# Patient Record
Sex: Male | Born: 1945 | Race: Black or African American | Hispanic: No | Marital: Married | Smoking: Former smoker
Health system: Southern US, Community
[De-identification: ages and names within clinical notes are randomized; demographics above are authoritative.]

## PROBLEM LIST (undated history)

## (undated) DIAGNOSIS — I10 Essential (primary) hypertension: Secondary | ICD-10-CM

## (undated) DIAGNOSIS — M109 Gout, unspecified: Secondary | ICD-10-CM

## (undated) HISTORY — PX: NO PAST SURGERIES: SHX2092

## (undated) HISTORY — DX: Gout, unspecified: M10.9

## (undated) HISTORY — DX: Essential (primary) hypertension: I10

---

## 2018-09-17 ENCOUNTER — Ambulatory Visit: Payer: Self-pay | Admitting: Physician Assistant

## 2018-09-20 ENCOUNTER — Ambulatory Visit (INDEPENDENT_AMBULATORY_CARE_PROVIDER_SITE_OTHER): Payer: Medicare Other | Admitting: Physician Assistant

## 2018-09-20 ENCOUNTER — Other Ambulatory Visit: Payer: Self-pay

## 2018-09-20 ENCOUNTER — Encounter: Payer: Self-pay | Admitting: Physician Assistant

## 2018-09-20 VITALS — BP 174/114 | HR 64 | Temp 98.4°F | Ht 64.0 in | Wt 166.0 lb

## 2018-09-20 DIAGNOSIS — Z13220 Encounter for screening for lipoid disorders: Secondary | ICD-10-CM

## 2018-09-20 DIAGNOSIS — I10 Essential (primary) hypertension: Secondary | ICD-10-CM | POA: Diagnosis not present

## 2018-09-20 DIAGNOSIS — R351 Nocturia: Secondary | ICD-10-CM

## 2018-09-20 DIAGNOSIS — Z125 Encounter for screening for malignant neoplasm of prostate: Secondary | ICD-10-CM

## 2018-09-20 DIAGNOSIS — Z0000 Encounter for general adult medical examination without abnormal findings: Secondary | ICD-10-CM | POA: Diagnosis not present

## 2018-09-20 DIAGNOSIS — M1A9XX0 Chronic gout, unspecified, without tophus (tophi): Secondary | ICD-10-CM | POA: Diagnosis not present

## 2018-09-20 DIAGNOSIS — Z131 Encounter for screening for diabetes mellitus: Secondary | ICD-10-CM | POA: Diagnosis not present

## 2018-09-20 LAB — CBC WITH DIFFERENTIAL/PLATELET
Absolute Monocytes: 525 cells/uL (ref 200–950)
Basophils Absolute: 19 cells/uL (ref 0–200)
Basophils Relative: 0.5 %
Eosinophils Absolute: 141 cells/uL (ref 15–500)
Eosinophils Relative: 3.8 %
HCT: 41.2 % (ref 38.5–50.0)
Hemoglobin: 14.5 g/dL (ref 13.2–17.1)
Lymphs Abs: 1876 cells/uL (ref 850–3900)
MCH: 30.8 pg (ref 27.0–33.0)
MCHC: 35.2 g/dL (ref 32.0–36.0)
MCV: 87.5 fL (ref 80.0–100.0)
MPV: 10.2 fL (ref 7.5–12.5)
Monocytes Relative: 14.2 %
Neutro Abs: 1140 cells/uL — ABNORMAL LOW (ref 1500–7800)
Neutrophils Relative %: 30.8 %
Platelets: 143 10*3/uL (ref 140–400)
RBC: 4.71 10*6/uL (ref 4.20–5.80)
RDW: 12.8 % (ref 11.0–15.0)
Total Lymphocyte: 50.7 %
WBC: 3.7 10*3/uL — ABNORMAL LOW (ref 3.8–10.8)

## 2018-09-20 MED ORDER — CARVEDILOL 12.5 MG PO TABS: 12.5000 mg | ORAL_TABLET | Freq: Two times a day (BID) | ORAL | 5 refills | Status: DC

## 2018-09-20 MED ORDER — ALLOPURINOL 300 MG PO TABS: 300.0000 mg | ORAL_TABLET | Freq: Every day | ORAL | 6 refills | Status: DC

## 2018-09-20 MED ORDER — INDOMETHACIN 25 MG PO CAPS: ORAL_CAPSULE | ORAL | 2 refills | Status: AC

## 2018-09-20 NOTE — Patient Instructions (Signed)

## 2018-09-20 NOTE — Progress Notes (Signed)
New Patient Office Visit  Subjective:  Patient ID: Arthur Stewart, male    DOB: 31-Dec-1945  Age: 73 y.o. MRN: [id number]  CC:  Chief Complaint  Patient presents with  . Establish Care    HPI Arthur BaldingGregory Stewart presents to establish care and to get medication refills. He [place] and out of all of his medications for at least 3 weeks maybe longer.   He denies any CP, headaches, dizziness, SOB.   He does have intermittent gout flares. No current flare.   Past Medical History:  Diagnosis Date  . Gout   . Hypertension     Past Surgical History:  Procedure Laterality Date  . NO PAST SURGERIES      Family History  Problem Relation Age of Onset  . Hypertension Other     Social History   Socioeconomic History  . Marital status: Married    Spouse name: Not on file  . Number of children: Not on file  . Years of education: Not on file  . Highest education level: Not on file  Occupational History  . Not on file  Social Needs  . Financial resource strain: Not on file  . Food insecurity    Worry: Not on file    Inability: Not on file  . Transportation needs    Medical: Not on file    Non-medical: Not on file  Tobacco Use  . Smoking status: Former Smoker    Packs/day: 1.00    Years: 20.00    Pack years: 20.00    Types: Cigarettes  . Smokeless tobacco: Never Used  Substance and Sexual Activity  . Alcohol use: Not on file  . Drug use: Never  . Sexual activity: Yes    Partners: Female  Lifestyle  . Physical activity    Days per week: Not on file    Minutes per session: Not on file  . Stress: Not on file  Relationships  . Social Musicianconnections    Talks on phone: Not on file    Gets together: Not on file    Attends religious service: Not on file    Active member of club or organization: Not on file    Attends meetings of clubs or organizations: Not on file    Relationship status: Not on file  . Intimate partner violence    Fear of current or ex partner:  Not on file    Emotionally abused: Not on file    Physically abused: Not on file    Forced sexual activity: Not on file  Other Topics Concern  . Not on file  Social History Narrative  . Not on file    ROS Review of Systems  All other systems reviewed and are negative.   Objective:   Today's Vitals: BP (!) 174/114 Comment: patient took at home prior to appt  Pulse 64   Temp 98.4 F (36.9 C) (Oral)   Ht 5\' 4"  (1.626 m)   Wt 166 lb (75.3 kg)   SpO2 98%   BMI 28.49 kg/m   Physical Exam Vitals signs reviewed.  Constitutional:      Appearance: Normal appearance.  HENT:     Head: Normocephalic.  Cardiovascular:     Rate and Rhythm: Normal rate and regular rhythm.     Pulses: Normal pulses.  Pulmonary:     Effort: Pulmonary effort is normal.     Breath sounds: Normal breath sounds.  Neurological:     General: No  focal deficit present.     Mental Status: He is alert and oriented to person, place, and time.  Psychiatric:        Mood and Affect: Mood normal.        Behavior: Behavior normal.     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 allopurinol (ZYLOPRIM) 300 MG tablet Take 1 tablet (300 mg total) by mouth  daily.  . carvedilol (COREG) 12.5 MG tablet Take 1 tablet (12.5 mg total) by mouth 2 (two) times daily with a meal.  . indomethacin (INDOCIN) 25 MG capsule Take one tablet as needed up to twice a day for gout pain.   No facility-administered encounter medications on file as of 09/20/2018.    EKG showed NSR with no ST elevation or depression. QRS wave was more pronounced but likely due to elevated blood pressure today.   BP is very elevated. Discussed with patient risk of elevated BP. Pt is asymptomatic. I am concerned coreg is not going to lower BP enough. Will restart coreg and recheck BP in one week.   Restarted allopurinol concerned might have a flare restarting. colchine is contraindicated with coreg. He will use indomethacin for flare. Once he has been on for a while can check uric acid levels to see if he is where he needs to be.   Pt needs labs today for screening purposes.  Follow-up: Return in about 1 week (around 09/27/2018) for F/U HTN.   Iran Planas, PA-C

## 2018-09-21 ENCOUNTER — Encounter: Payer: Self-pay | Admitting: Physician Assistant

## 2018-09-21 ENCOUNTER — Other Ambulatory Visit: Payer: Self-pay | Admitting: Neurology

## 2018-09-21 DIAGNOSIS — E781 Pure hyperglyceridemia: Secondary | ICD-10-CM | POA: Insufficient documentation

## 2018-09-21 DIAGNOSIS — E785 Hyperlipidemia, unspecified: Secondary | ICD-10-CM | POA: Insufficient documentation

## 2018-09-21 DIAGNOSIS — R748 Abnormal levels of other serum enzymes: Secondary | ICD-10-CM | POA: Insufficient documentation

## 2018-09-21 LAB — COMPLETE METABOLIC PANEL WITH GFR
AG Ratio: 1.1 (calc) (ref 1.0–2.5)
ALT: 42 U/L (ref 9–46)
AST: 61 U/L — ABNORMAL HIGH (ref 10–35)
Albumin: 4 g/dL (ref 3.6–5.1)
Alkaline phosphatase (APISO): 114 U/L (ref 35–144)
BUN: 17 mg/dL (ref 7–25)
CO2: 26 mmol/L (ref 20–32)
Calcium: 10.3 mg/dL (ref 8.6–10.3)
Chloride: 102 mmol/L (ref 98–110)
Creat: 0.99 mg/dL (ref 0.70–1.18)
GFR, Est African American: 88 mL/min/{1.73_m2} (ref >=60)
GFR, Est Non African American: 76 mL/min/{1.73_m2} (ref >=60)
Globulin: 3.6 g/dL (calc) (ref 1.9–3.7)
Glucose, Bld: 97 mg/dL (ref 65–99)
Potassium: 4 mmol/L (ref 3.5–5.3)
Sodium: 140 mmol/L (ref 135–146)
Total Bilirubin: 1 mg/dL (ref 0.2–1.2)
Total Protein: 7.6 g/dL (ref 6.1–8.1)

## 2018-09-21 LAB — LIPID PANEL W/REFLEX DIRECT LDL
Cholesterol: 201 mg/dL — ABNORMAL HIGH (ref <200)
HDL: 59 mg/dL (ref >=40)
LDL Cholesterol (Calc): 106 mg/dL (calc) — ABNORMAL HIGH
Non-HDL Cholesterol (Calc): 142 mg/dL (calc) — ABNORMAL HIGH (ref <130)
Total CHOL/HDL Ratio: 3.4 (calc) (ref <5.0)
Triglycerides: 236 mg/dL — ABNORMAL HIGH (ref <150)

## 2018-09-21 LAB — PSA: PSA: 0.7 ng/mL (ref <=4.0)

## 2018-09-21 NOTE — Progress Notes (Signed)
Call pt: Labs look pretty good. Kidney and glucose look great.  You have one liver enzyme that is elevated. Lets recheck in 1 month and see if resolves or worsens. Avoid a lot of tylenol products or alcohol.  Prostate screening looks great.   Your cholesterol does not look terrible with numbers but when we include you risk factors there is a 19 percent CV 10 year risk. Anything over 7.5 AHA recommends statin therapy. I believe this is the next best step to decrease your risk. Can I send to pharmacy?   Your TG are elevated as well. Taking fish oil 4000mg  daily could help with that.

## 2018-09-24 MED ORDER — ATORVASTATIN CALCIUM 20 MG PO TABS: 20.0000 mg | ORAL_TABLET | Freq: Every day | ORAL | 3 refills | Status: AC

## 2018-09-24 NOTE — Addendum Note (Signed)
Addended by: Donella Stade on: 09/24/2018 07:30 AM   Modules accepted: Orders

## 2018-09-27 ENCOUNTER — Ambulatory Visit: Payer: Medicare Other | Admitting: Physician Assistant

## 2018-09-28 ENCOUNTER — Ambulatory Visit (INDEPENDENT_AMBULATORY_CARE_PROVIDER_SITE_OTHER): Payer: Medicare Other | Admitting: Physician Assistant

## 2018-09-28 ENCOUNTER — Encounter: Payer: Self-pay | Admitting: Physician Assistant

## 2018-09-28 ENCOUNTER — Other Ambulatory Visit: Payer: Self-pay

## 2018-09-28 VITALS — BP 188/121 | HR 64 | Ht 64.0 in | Wt 170.0 lb

## 2018-09-28 DIAGNOSIS — I10 Essential (primary) hypertension: Secondary | ICD-10-CM | POA: Diagnosis not present

## 2018-09-28 MED ORDER — AMLODIPINE BESYLATE 5 MG PO TABS: 5.0000 mg | ORAL_TABLET | Freq: Every day | ORAL | 0 refills | Status: DC

## 2018-09-28 NOTE — Patient Instructions (Signed)
Report blood pressure readings from now until Tuesday on Tuesday next week.   Start norvasc.   DASH Eating Plan DASH stands for "Dietary Approaches to Stop Hypertension." The DASH eating plan is a healthy eating plan that has been shown to reduce high blood pressure (hypertension). It may also reduce your risk for type 2 diabetes, heart disease, and stroke. The DASH eating plan may also help with weight loss. What are tips for following this plan?  General guidelines  Avoid eating more than 2,300 mg (milligrams) of salt (sodium) a day. If you have hypertension, you may need to reduce your sodium intake to 1,500 mg a day.  Limit alcohol intake to no more than 1 drink a day for nonpregnant women and 2 drinks a day for men. One drink equals 12 oz of beer, 5 oz of wine, or 1 oz of hard liquor.  Work with your health care provider to maintain a healthy body weight or to lose weight. Ask what an ideal weight is for you.  Get at least 30 minutes of exercise that causes your heart to beat faster (aerobic exercise) most days of the week. Activities may include walking, swimming, or biking.  Work with your health care provider or diet and nutrition specialist (dietitian) to adjust your eating plan to your individual calorie needs. Reading food labels   Check food labels for the amount of sodium per serving. Choose foods with less than 5 percent of the Daily Value of sodium. Generally, foods with less than 300 mg of sodium per serving fit into this eating plan.  To find whole grains, look for the word "whole" as the first word in the ingredient list. Shopping  Buy products labeled as "low-sodium" or "no salt added."  Buy fresh foods. Avoid canned foods and premade or frozen meals. Cooking  Avoid adding salt when cooking. Use salt-free seasonings or herbs instead of table salt or sea salt. Check with your health care provider or pharmacist before using salt substitutes.  Do not fry foods. Cook  foods using healthy methods such as baking, boiling, grilling, and broiling instead.  Cook with heart-healthy oils, such as olive, canola, soybean, or sunflower oil. Meal planning  Eat a balanced diet that includes: ? 5 or more servings of fruits and vegetables each day. At each meal, try to fill half of your plate with fruits and vegetables. ? Up to 6-8 servings of whole grains each day. ? Less than 6 oz of lean meat, poultry, or fish each day. A 3-oz serving of meat is about the same size as a deck of cards. One egg equals 1 oz. ? 2 servings of low-fat dairy each day. ? A serving of nuts, seeds, or beans 5 times each week. ? Heart-healthy fats. Healthy fats called Omega-3 fatty acids are found in foods such as flaxseeds and coldwater fish, like sardines, salmon, and mackerel.  Limit how much you eat of the following: ? Canned or prepackaged foods. ? Food that is high in trans fat, such as fried foods. ? Food that is high in saturated fat, such as fatty meat. ? Sweets, desserts, sugary drinks, and other foods with added sugar. ? Full-fat dairy products.  Do not salt foods before eating.  Try to eat at least 2 vegetarian meals each week.  Eat more home-cooked food and less restaurant, buffet, and fast food.  When eating at a restaurant, ask that your food be prepared with less salt or no salt, if possible.  What foods are recommended? The items listed may not be a complete list. Talk with your dietitian about what dietary choices are best for you. Grains Whole-grain or whole-wheat bread. Whole-grain or whole-wheat pasta. Brown rice. [place]. Bulgur. Whole-grain and low-sodium cereals. Pita bread. Low-fat, low-sodium crackers. Whole-wheat flour tortillas. Vegetables Fresh or frozen vegetables (raw, steamed, roasted, or grilled). Low-sodium or reduced-sodium tomato and vegetable juice. Low-sodium or reduced-sodium tomato sauce and tomato paste. Low-sodium or reduced-sodium canned  vegetables. Fruits All fresh, dried, or frozen fruit. Canned fruit in natural juice (without added sugar). Meat and other protein foods Skinless chicken or [place]. Ground chicken or [place]. Pork with fat trimmed off. Fish and seafood. Egg whites. Dried beans, peas, or lentils. Unsalted nuts, nut butters, and seeds. Unsalted canned beans. Lean cuts of beef with fat trimmed off. Low-sodium, lean deli meat. Dairy Low-fat (1%) or fat-free (skim) milk. Fat-free, low-fat, or reduced-fat cheeses. Nonfat, low-sodium ricotta or cottage cheese. Low-fat or nonfat yogurt. Low-fat, low-sodium cheese. Fats and oils Soft margarine without trans fats. Vegetable oil. Low-fat, reduced-fat, or light mayonnaise and salad dressings (reduced-sodium). Canola, safflower, olive, soybean, and sunflower oils. Avocado. Seasoning and other foods Herbs. Spices. Seasoning mixes without salt. Unsalted popcorn and pretzels. Fat-free sweets. What foods are not recommended? The items listed may not be a complete list. Talk with your dietitian about what dietary choices are best for you. Grains Baked goods made with fat, such as croissants, muffins, or some breads. Dry pasta or rice meal packs. Vegetables Creamed or fried vegetables. Vegetables in a cheese sauce. Regular canned vegetables (not low-sodium or reduced-sodium). Regular canned tomato sauce and paste (not low-sodium or reduced-sodium). Regular tomato and vegetable juice (not low-sodium or reduced-sodium). Angie Fava. Olives. Fruits Canned fruit in a light or heavy syrup. Fried fruit. Fruit in cream or butter sauce. Meat and other protein foods Fatty cuts of meat. Ribs. Fried meat. Berniece Salines. Sausage. Bologna and other processed lunch meats. Salami. Fatback. Hotdogs. Bratwurst. Salted nuts and seeds. Canned beans with added salt. Canned or smoked fish. Whole eggs or egg yolks. Chicken or [place] with skin. Dairy Whole or 2% milk, cream, and half-and-half. Whole or full-fat  cream cheese. Whole-fat or sweetened yogurt. Full-fat cheese. Nondairy creamers. Whipped toppings. Processed cheese and cheese spreads. Fats and oils Butter. Stick margarine. Lard. Shortening. Ghee. Bacon fat. Tropical oils, such as coconut, palm kernel, or palm oil. Seasoning and other foods Salted popcorn and pretzels. Onion salt, garlic salt, seasoned salt, table salt, and sea salt. Worcestershire sauce. Tartar sauce. Barbecue sauce. Teriyaki sauce. Soy sauce, including reduced-sodium. Steak sauce. Canned and packaged gravies. Fish sauce. Oyster sauce. Cocktail sauce. Horseradish that you find on the shelf. Ketchup. Mustard. Meat flavorings and tenderizers. Bouillon cubes. Hot sauce and Tabasco sauce. Premade or packaged marinades. Premade or packaged taco seasonings. Relishes. Regular salad dressings. Where to find more information:  National Heart, Lung, and [place]: https://wilson-eaton.com/  American Heart Association: www.heart.org Summary  The DASH eating plan is a healthy eating plan that has been shown to reduce high blood pressure (hypertension). It may also reduce your risk for type 2 diabetes, heart disease, and stroke.  With the DASH eating plan, you should limit salt (sodium) intake to 2,300 mg a day. If you have hypertension, you may need to reduce your sodium intake to 1,500 mg a day.  When on the DASH eating plan, aim to eat more fresh fruits and vegetables, whole grains, lean proteins, low-fat dairy, and heart-healthy fats.  Work  with your health care provider or diet and nutrition specialist (dietitian) to adjust your eating plan to your individual calorie needs. This information is not intended to replace advice given to you by your health care provider. Make sure you discuss any questions you have with your health care provider. Document Released: 02/03/2011 Document Revised: 01/27/2017 Document Reviewed: 02/08/2016 Elsevier Patient Education  2020 [employer].

## 2018-09-28 NOTE — Progress Notes (Signed)
   Subjective:    Patient ID: Arthur Stewart, male    DOB: May 14, 1945, 73 y.o.   MRN: [id number]  HPI  Pt is a 73 yo male with uncontrolled HTN who presents to the clinic for 2 week follow up. Last visit patient was not on medication for 8 weeks. His medications were restarted which included coreg.  His BP has not gone down very much. He does report better readings at home of 160's over 90's. Pt is completely asymptomatic no CP, palpitations, headache or vision changes.   .. Active Ambulatory Problems    Diagnosis Date Noted  . Uncontrolled hypertension 09/20/2018  . Chronic gout without tophus 09/20/2018  . Nocturia 09/20/2018  . Hyperlipidemia 09/21/2018  . Elevated liver enzymes 09/21/2018  . Hypertriglyceridemia 09/21/2018   Resolved Ambulatory Problems    Diagnosis Date Noted  . No Resolved Ambulatory Problems   Past Medical History:  Diagnosis Date  . Gout   . Hypertension    Reviewed med, allergy, problem list.    Review of Systems See HPI.     Objective:   Physical Exam Vitals signs reviewed.  Constitutional:      Appearance: Normal appearance.  HENT:     Head: Normocephalic.  Cardiovascular:     Rate and Rhythm: Normal rate and regular rhythm.     Pulses: Normal pulses.  Pulmonary:     Effort: Pulmonary effort is normal.     Breath sounds: Normal breath sounds.  Neurological:     General: No focal deficit present.     Mental Status: He is alert and oriented to person, place, and time.  Psychiatric:        Mood and Affect: Mood normal.        Behavior: Behavior normal.           Assessment & Plan:  Marland KitchenMarland KitchenRoswell was seen today for hypertension.  Diagnoses and all orders for this visit:  Uncontrolled hypertension -     amLODipine (NORVASC) 5 MG tablet; Take 1 tablet (5 mg total) by mouth daily.  HR 60. BP very elevated. Better readings reported at home. Added norvasc. Keep checking BP at home. Report readings next Tuesday. Will adjust accordingly.  Recheck in office in 3 weeks.  Discussed low salt diet. Discussed IF 16:8 has proven to help lower BP overtime. Discussed risks of uncontrolled HTN.

## 2018-10-02 ENCOUNTER — Ambulatory Visit (INDEPENDENT_AMBULATORY_CARE_PROVIDER_SITE_OTHER): Payer: Medicare Other | Admitting: Physician Assistant

## 2018-10-02 ENCOUNTER — Other Ambulatory Visit: Payer: Self-pay

## 2018-10-02 DIAGNOSIS — I10 Essential (primary) hypertension: Secondary | ICD-10-CM

## 2018-10-02 MED ORDER — AMLODIPINE BESYLATE 5 MG PO TABS: 5.0000 mg | ORAL_TABLET | Freq: Every day | ORAL | 0 refills | Status: DC

## 2018-10-02 NOTE — Progress Notes (Signed)
   Subjective:    Patient ID: Arthur Stewart, male    DOB: [birth year], 73 y.o.   MRN: [id number]  HPI  Pt is a 73 yo male with uncontrolled HTN who presents to the clinic for BP recheck. He continues to be asymptomatic. He does admit to feeling much better on the medication. He is checking BP at home and at the beginning running 150's over 90s but yesterday was 118's over 80's. He denies any dizziness.   .. Active Ambulatory Problems    Diagnosis Date Noted  . Uncontrolled hypertension 09/20/2018  . Chronic gout without tophus 09/20/2018  . Nocturia 09/20/2018  . Hyperlipidemia 09/21/2018  . Elevated liver enzymes 09/21/2018  . Hypertriglyceridemia 09/21/2018   Resolved Ambulatory Problems    Diagnosis Date Noted  . No Resolved Ambulatory Problems   Past Medical History:  Diagnosis Date  . Gout   . Hypertension        Review of Systems  All other systems reviewed and are negative.      Objective:   Physical Exam Vitals signs reviewed.  Constitutional:      Appearance: Normal appearance.  Cardiovascular:     Rate and Rhythm: Normal rate and regular rhythm.     Pulses: Normal pulses.  Pulmonary:     Effort: Pulmonary effort is normal.     Breath sounds: Normal breath sounds.  Neurological:     General: No focal deficit present.     Mental Status: He is alert and oriented to person, place, and time.  Psychiatric:        Mood and Affect: Mood normal.        Behavior: Behavior normal.           Assessment & Plan:  Marland KitchenMarland KitchenNechemia was seen today for hypertension.  Diagnoses and all orders for this visit:  Uncontrolled hypertension -     amLODipine (NORVASC) 5 MG tablet; Take 1 tablet (5 mg total) by mouth daily.   First BP in the office was terrible. 2nd recheck was much better. His home readings look awesome. He is going to continue to monitor BP at home and keep a log. Continue on norvasc and coreg and follow up in 1 month. Reminder to lower salt in diet.

## 2018-10-30 ENCOUNTER — Other Ambulatory Visit: Payer: Self-pay

## 2018-10-30 ENCOUNTER — Encounter: Payer: Self-pay | Admitting: Physician Assistant

## 2018-10-30 ENCOUNTER — Ambulatory Visit (INDEPENDENT_AMBULATORY_CARE_PROVIDER_SITE_OTHER): Payer: Medicare Other | Admitting: Physician Assistant

## 2018-10-30 VITALS — BP 142/90 | HR 60 | Ht 64.0 in | Wt 178.0 lb

## 2018-10-30 DIAGNOSIS — M25561 Pain in right knee: Secondary | ICD-10-CM | POA: Diagnosis not present

## 2018-10-30 DIAGNOSIS — M1A9XX0 Chronic gout, unspecified, without tophus (tophi): Secondary | ICD-10-CM | POA: Diagnosis not present

## 2018-10-30 DIAGNOSIS — Z1211 Encounter for screening for malignant neoplasm of colon: Secondary | ICD-10-CM | POA: Diagnosis not present

## 2018-10-30 DIAGNOSIS — G8929 Other chronic pain: Secondary | ICD-10-CM

## 2018-10-30 DIAGNOSIS — I10 Essential (primary) hypertension: Secondary | ICD-10-CM

## 2018-10-30 MED ORDER — DICLOFENAC SODIUM 1 % TD GEL: 4.0000 g | Freq: Four times a day (QID) | TRANSDERMAL | 1 refills | Status: AC

## 2018-10-30 MED ORDER — AMLODIPINE BESYLATE 5 MG PO TABS: 5.0000 mg | ORAL_TABLET | Freq: Every day | ORAL | 1 refills | Status: AC

## 2018-10-30 MED ORDER — ALLOPURINOL 300 MG PO TABS: 300.0000 mg | ORAL_TABLET | Freq: Every day | ORAL | 1 refills | Status: AC

## 2018-10-30 MED ORDER — CARVEDILOL 12.5 MG PO TABS: 12.5000 mg | ORAL_TABLET | Freq: Two times a day (BID) | ORAL | 1 refills | Status: AC

## 2018-10-30 NOTE — Progress Notes (Signed)
Subjective:    Patient ID: Arthur Stewart, male    DOB: [birth year], 73 y.o.   MRN: [id number]  HPI  Pt is a 73 yo male with HTN, HLD, and gout who presents to the clinic for BP refills.   He feels great. He has been checking his BP daily and ranges between 120's and 150's and 80-90's. He denies any headaches, vision changes, dizziness. He is taking his medications daily.   He does take indomethcin occasionally if he feels a gout attack or if his right knee hurts a lot. He has been noticing more right knee pain off and on. Has had some pain for over a year but increasing in frequency. No trauma or fall.  It is very stiff and swollen more in the mornings and gets better throughout the day.   .. Active Ambulatory Problems    Diagnosis Date Noted  . Hypertension, essential, benign 09/20/2018  . Chronic gout without tophus 09/20/2018  . Nocturia 09/20/2018  . Hyperlipidemia 09/21/2018  . Elevated liver enzymes 09/21/2018  . Hypertriglyceridemia 09/21/2018  . Chronic pain of right knee 10/30/2018   Resolved Ambulatory Problems    Diagnosis Date Noted  . No Resolved Ambulatory Problems   Past Medical History:  Diagnosis Date  . Gout   . Hypertension      Review of Systems  All other systems reviewed and are negative.      Objective:   Physical Exam Vitals signs reviewed.  Constitutional:      Appearance: Normal appearance.  HENT:     Head: Normocephalic.  Cardiovascular:     Rate and Rhythm: Normal rate and regular rhythm.     Pulses: Normal pulses.     Heart sounds: Normal heart sounds.  Pulmonary:     Effort: Pulmonary effort is normal.     Breath sounds: Normal breath sounds.  Neurological:     General: No focal deficit present.     Mental Status: He is alert and oriented to person, place, and time.  Psychiatric:        Mood and Affect: Mood normal.        Behavior: Behavior normal.           Assessment & Plan:  Marland KitchenMarland KitchenDallen was seen today for  hypertension.  Diagnoses and all orders for this visit:  Hypertension, essential, benign -     amLODipine (NORVASC) 5 MG tablet; Take 1 tablet (5 mg total) by mouth daily. -     carvedilol (COREG) 12.5 MG tablet; Take 1 tablet (12.5 mg total) by mouth 2 (two) times daily with a meal.  Colon cancer screening -     Ambulatory referral to Gastroenterology  Uncontrolled hypertension  Chronic pain of right knee -     diclofenac sodium (VOLTAREN) 1 % GEL; Apply 4 g topically 4 (four) times daily. To affected joint.  Chronic gout without tophus, unspecified cause, unspecified site -     allopurinol (ZYLOPRIM) 300 MG tablet; Take 1 tablet (300 mg total) by mouth daily.   2nd BP much better and home BP are much better. Diastaltic is still not to goal but at times does drop low 80's. Discussed NSAIDs could increase BP. Use sparingly. 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 Declined xrays today. Given HO. Use gel and consider tumeric or glucosamine chondrotin. Follow up for xrays and/or to consider more intervention with sports medicine.   Resent all medications for 90 days.   Follow up in 6 months for BP  check.

## 2018-10-30 NOTE — Patient Instructions (Signed)
Glucosamine chondrotin Or  Tumeric.   Osteoarthritis  Osteoarthritis is a type of arthritis that affects tissue that covers the ends of bones in joints (cartilage). Cartilage acts as a cushion between the bones and helps them move smoothly. Osteoarthritis results when cartilage in the joints gets worn down. Osteoarthritis is sometimes called "wear and tear" arthritis. Osteoarthritis is the most common form of arthritis. It often occurs in older people. It is a condition that gets worse over time (a progressive condition). Joints that are most often affected by this condition are in:  Fingers.  Toes.  Hips.  Knees.  Spine, including neck and lower back. What are the causes? This condition is caused by age-related wearing down of cartilage that covers the ends of bones. What increases the risk? The following factors may make you more likely to develop this condition:  Older age.  Being overweight or obese.  Overuse of joints, such as in athletes.  Past injury of a joint.  Past surgery on a joint.  Family history of osteoarthritis. What are the signs or symptoms? The main symptoms of this condition are pain, swelling, and stiffness in the joint. The joint may lose its shape over time. Small pieces of bone or cartilage may break off and float inside of the joint, which may cause more pain and damage to the joint. Small deposits of bone (osteophytes) may grow on the edges of the joint. Other symptoms may include:  A grating or scraping feeling inside the joint when you move it.  Popping or creaking sounds when you move. Symptoms may affect one or more joints. Osteoarthritis in a major joint, such as your knee or hip, can make it painful to walk or exercise. If you have osteoarthritis in your hands, you might not be able to grip items, twist your hand, or control small movements of your hands and fingers (fine motor skills). How is this diagnosed? This condition may be diagnosed  based on:  Your medical history.  A physical exam.  Your symptoms.  X-rays of the affected joint(s).  Blood tests to rule out other types of arthritis. How is this treated? There is no cure for this condition, but treatment can help to control pain and improve joint function. Treatment plans may include:  A prescribed exercise program that allows for rest and joint relief. You may work with a physical therapist.  A weight control plan.  Pain relief techniques, such as: ? Applying heat and cold to the joint. ? Electric pulses delivered to nerve endings under the skin (transcutaneous electrical nerve stimulation, or TENS). ? Massage. ? Certain nutritional supplements.  NSAIDs or prescription medicines to help relieve pain.  Medicine to help relieve pain and inflammation (corticosteroids). This can be given by mouth (orally) or as an injection.  Assistive devices, such as a brace, wrap, splint, specialized glove, or cane.  Surgery, such as: ? An osteotomy. This is done to reposition the bones and relieve pain or to remove loose pieces of bone and cartilage. ? Joint replacement surgery. You may need this surgery if you have very bad (advanced) osteoarthritis. Follow these instructions at home: Activity  Rest your affected joints as directed by your health care provider.  Do not drive or use heavy machinery while taking prescription pain medicine.  Exercise as directed. Your health care provider or physical therapist may recommend specific types of exercise, such as: ? Strengthening exercises. These are done to strengthen the muscles that support joints that are  affected by arthritis. They can be performed with weights or with exercise bands to add resistance. ? Aerobic activities. These are exercises, such as brisk walking or water aerobics, that get your heart pumping. ? Range-of-motion activities. These keep your joints easy to move. ? Balance and agility exercises.  Managing pain, stiffness, and swelling      If directed, apply heat to the affected area as often as told by your health care provider. Use the heat source that your health care provider recommends, such as a moist heat pack or a heating pad. ? If you have a removable assistive device, remove it as told by your health care provider. ? Place a towel between your skin and the heat source. If your health care provider tells you to keep the assistive device on while you apply heat, place a towel between the assistive device and the heat source. ? Leave the heat on for 20-30 minutes. ? Remove the heat if your skin turns bright red. This is especially important if you are unable to feel pain, heat, or cold. You may have a greater risk of getting burned.  If directed, put ice on the affected joint: ? If you have a removable assistive device, remove it as told by your health care provider. ? Put ice in a plastic bag. ? Place a towel between your skin and the bag. If your health care provider tells you to keep the assistive device on during icing, place a towel between the assistive device and the bag. ? Leave the ice on for 20 minutes, 2-3 times a day. General instructions  Take over-the-counter and prescription medicines only as told by your health care provider.  Maintain a healthy weight. Follow instructions from your health care provider for weight control. These may include dietary restrictions.  Do not use any products that contain nicotine or tobacco, such as cigarettes and e-cigarettes. These can delay bone healing. If you need help quitting, ask your health care provider.  Use assistive devices as directed by your health care provider.  Keep all follow-up visits as told by your health care provider. This is important. Where to find more information  [employer] of Arthritis and Musculoskeletal and Skin Diseases: www.niams.SouthExposed.es  [employer] on Aging: http://kim-miller.com/   American College of Rheumatology: www.rheumatology.org Contact a health care provider if:  Your skin turns red.  You develop a rash.  You have pain that gets worse.  You have a fever along with joint or muscle aches. Get help right away if:  You lose a lot of weight.  You suddenly lose your appetite.  You have night sweats. Summary  Osteoarthritis is a type of arthritis that affects tissue covering the ends of bones in joints (cartilage).  This condition is caused by age-related wearing down of cartilage that covers the ends of bones.  The main symptom of this condition is pain, swelling, and stiffness in the joint.  There is no cure for this condition, but treatment can help to control pain and improve joint function. This information is not intended to replace advice given to you by your health care provider. Make sure you discuss any questions you have with your health care provider. Document Released: 02/14/2005 Document Revised: 01/27/2017 Document Reviewed: 10/19/2015 Elsevier Patient Education  2020 [employer].

## 2018-12-26 NOTE — Addendum Note (Signed)
Addended byAnnamaria Helling on: 12/26/2018 08:13 AM   Modules accepted: Orders

## 2019-03-27 ENCOUNTER — Telehealth: Payer: Self-pay

## 2019-03-27 ENCOUNTER — Other Ambulatory Visit: Payer: Self-pay

## 2019-03-27 ENCOUNTER — Encounter: Payer: Self-pay | Admitting: Physician Assistant

## 2019-03-27 ENCOUNTER — Ambulatory Visit (INDEPENDENT_AMBULATORY_CARE_PROVIDER_SITE_OTHER): Payer: Medicare Other

## 2019-03-27 ENCOUNTER — Ambulatory Visit (INDEPENDENT_AMBULATORY_CARE_PROVIDER_SITE_OTHER): Payer: Medicare Other | Admitting: Physician Assistant

## 2019-03-27 VITALS — BP 139/98 | HR 80 | Ht 64.0 in | Wt 178.0 lb

## 2019-03-27 DIAGNOSIS — E871 Hypo-osmolality and hyponatremia: Secondary | ICD-10-CM

## 2019-03-27 DIAGNOSIS — Z8616 Personal history of COVID-19: Secondary | ICD-10-CM

## 2019-03-27 DIAGNOSIS — R509 Fever, unspecified: Secondary | ICD-10-CM

## 2019-03-27 DIAGNOSIS — Z87891 Personal history of nicotine dependence: Secondary | ICD-10-CM | POA: Diagnosis not present

## 2019-03-27 DIAGNOSIS — R531 Weakness: Secondary | ICD-10-CM

## 2019-03-27 DIAGNOSIS — N179 Acute kidney failure, unspecified: Secondary | ICD-10-CM

## 2019-03-27 DIAGNOSIS — J181 Lobar pneumonia, unspecified organism: Secondary | ICD-10-CM | POA: Diagnosis not present

## 2019-03-27 DIAGNOSIS — J189 Pneumonia, unspecified organism: Secondary | ICD-10-CM

## 2019-03-27 DIAGNOSIS — R197 Diarrhea, unspecified: Secondary | ICD-10-CM

## 2019-03-27 LAB — COMPLETE METABOLIC PANEL WITH GFR
AG Ratio: 0.7 (calc) — ABNORMAL LOW (ref 1.0–2.5)
ALT: 36 U/L (ref 9–46)
AST: 60 U/L — ABNORMAL HIGH (ref 10–35)
Albumin: 2.9 g/dL — ABNORMAL LOW (ref 3.6–5.1)
Alkaline phosphatase (APISO): 74 U/L (ref 35–144)
BUN/Creatinine Ratio: 21 (calc) (ref 6–22)
BUN: 35 mg/dL — ABNORMAL HIGH (ref 7–25)
CO2: 25 mmol/L (ref 20–32)
Calcium: 8.8 mg/dL (ref 8.6–10.3)
Chloride: 95 mmol/L — ABNORMAL LOW (ref 98–110)
Creat: 1.63 mg/dL — ABNORMAL HIGH (ref 0.70–1.18)
GFR, Est African American: 48 mL/min/{1.73_m2} — ABNORMAL LOW (ref >=60)
GFR, Est Non African American: 41 mL/min/{1.73_m2} — ABNORMAL LOW (ref >=60)
Globulin: 4.3 g/dL (calc) — ABNORMAL HIGH (ref 1.9–3.7)
Glucose, Bld: 126 mg/dL — ABNORMAL HIGH (ref 65–99)
Potassium: 4.1 mmol/L (ref 3.5–5.3)
Sodium: 128 mmol/L — ABNORMAL LOW (ref 135–146)
Total Bilirubin: 1.9 mg/dL — ABNORMAL HIGH (ref 0.2–1.2)
Total Protein: 7.2 g/dL (ref 6.1–8.1)

## 2019-03-27 LAB — CBC WITH DIFFERENTIAL/PLATELET
Absolute Monocytes: 644 cells/uL (ref 200–950)
Basophils Absolute: 10 cells/uL (ref 0–200)
Basophils Relative: 0.1 %
Eosinophils Absolute: 99 cells/uL (ref 15–500)
Eosinophils Relative: 1 %
HCT: 36.7 % — ABNORMAL LOW (ref 38.5–50.0)
Hemoglobin: 12.7 g/dL — ABNORMAL LOW (ref 13.2–17.1)
Lymphs Abs: 901 cells/uL (ref 850–3900)
MCH: 30.8 pg (ref 27.0–33.0)
MCHC: 34.6 g/dL (ref 32.0–36.0)
MCV: 88.9 fL (ref 80.0–100.0)
MPV: 9.7 fL (ref 7.5–12.5)
Monocytes Relative: 6.5 %
Neutro Abs: 8247 cells/uL — ABNORMAL HIGH (ref 1500–7800)
Neutrophils Relative %: 83.3 %
Platelets: 265 10*3/uL (ref 140–400)
RBC: 4.13 10*6/uL — ABNORMAL LOW (ref 4.20–5.80)
RDW: 12 % (ref 11.0–15.0)
Total Lymphocyte: 9.1 %
WBC: 9.9 10*3/uL (ref 3.8–10.8)

## 2019-03-27 MED ORDER — AZITHROMYCIN 250 MG PO TABS: ORAL_TABLET | ORAL | 0 refills | Status: DC

## 2019-03-27 NOTE — Progress Notes (Signed)
Bilateral pneumonia. Could be more viral but with your worsening symptoms will start antibiotic therapy for bacterial coverage. Will also call with labs when get them.

## 2019-03-27 NOTE — Telephone Encounter (Signed)
Patient has already had his labs drawn. Advised to start antibiotic.

## 2019-03-27 NOTE — Telephone Encounter (Signed)
Arthur Stewart's wife called and states there was a little confusion about the lab. She states he left without having the labs drawn. She states he will come back later today.

## 2019-03-27 NOTE — Progress Notes (Signed)
Patient ID: Arthur Stewart, male   DOB: [birth year], 74 y.o.   MRN: [id number] .Marland KitchenVirtual Visit via Video Note  I connected with Anda Kraft on 03/27/2019 at  8:30 AM EST by a telemedicine application and verified that I am speaking with the correct person using two identifiers.  Location: Patient: home Provider: clinic   I discussed the limitations of evaluation and management by telemedicine and the availability of in person appointments. The patient expressed understanding and agreed to proceed.  History of Present Illness: Pt is a 74 yo male with HTN, HLD who calls into the clinic with his wife to discuss covid symptoms. The whole family was positive on Jan. 4th. He got tested due to household exposure with his son. His symptoms started more mild at first. He is now over the past few days starting to feel worse. Today is covid day 23. For 2-3 days had some loose stool. Took imodium and helped a lot. Has no energy and very weak. Per wife had a fever of 103 last night. No rashes. No SOB. He has been checking his lungs and pulse ox 95 percent or better. Wife is concerned about dehydration.   .. Active Ambulatory Problems    Diagnosis Date Noted  . Hypertension, essential, benign 09/20/2018  . Chronic gout without tophus 09/20/2018  . Nocturia 09/20/2018  . Hyperlipidemia 09/21/2018  . Elevated liver enzymes 09/21/2018  . Hypertriglyceridemia 09/21/2018  . Chronic pain of right knee 10/30/2018  . Hyponatremia 03/29/2019  . AKI (acute kidney injury) ([address]) 03/29/2019   Resolved Ambulatory Problems    Diagnosis Date Noted  . No Resolved Ambulatory Problems   Past Medical History:  Diagnosis Date  . Gout   . Hypertension    Reviewed med, allergy, problem list.    Observations/Objective: Most of conversation done through wife.  He agreed or disagreed with statements.  Sounded weak and tired.  No cough.   .. Today's Vitals   03/27/19 0720  BP: (!) 139/98  Pulse: 80  Weight:  178 lb (80.7 kg)  Height: 5\' 4"  (1.626 m)   Body mass index is 30.55 kg/m.  .. Results for orders placed or performed in visit on 03/27/19  COMPLETE METABOLIC PANEL WITH GFR  Result Value Ref Range   Glucose, Bld 126 (H) 65 - 99 mg/dL   BUN 35 (H) 7 - 25 mg/dL   Creat 1.63 (H) 0.70 - 1.18 mg/dL   GFR, Est Non African American 41 (L) > OR = 60 mL/min/1.36m2   GFR, Est African American 48 (L) > OR = 60 mL/min/1.37m2   BUN/Creatinine Ratio 21 6 - 22 (calc)   Sodium 128 (L) 135 - 146 mmol/L   Potassium 4.1 3.5 - 5.3 mmol/L   Chloride 95 (L) 98 - 110 mmol/L   CO2 25 20 - 32 mmol/L   Calcium 8.8 8.6 - 10.3 mg/dL   Total Protein 7.2 6.1 - 8.1 g/dL   Albumin 2.9 (L) 3.6 - 5.1 g/dL   Globulin 4.3 (H) 1.9 - 3.7 g/dL (calc)   AG Ratio 0.7 (L) 1.0 - 2.5 (calc)   Total Bilirubin 1.9 (H) 0.2 - 1.2 mg/dL   Alkaline phosphatase (APISO) 74 35 - 144 U/L   AST 60 (H) 10 - 35 U/L   ALT 36 9 - 46 U/L  CBC with Differential/Platelet  Result Value Ref Range   WBC 9.9 3.8 - 10.8 Thousand/uL   RBC 4.13 (L) 4.20 - 5.80 Million/uL   Hemoglobin 12.7 (  L) 13.2 - 17.1 g/dL   HCT 16.1 (L) 09.6 - 04.5 %   MCV 88.9 80.0 - 100.0 fL   MCH 30.8 27.0 - 33.0 pg   MCHC 34.6 32.0 - 36.0 g/dL   RDW 40.9 81.1 - 91.4 %   Platelets 265 140 - 400 Thousand/uL   MPV 9.7 7.5 - 12.5 fL   Neutro Abs 8,247 (H) 1,500 - 7,800 cells/uL   Lymphs Abs 901 850 - 3,900 cells/uL   Absolute Monocytes 644 200 - 950 cells/uL   Eosinophils Absolute 99 15 - 500 cells/uL   Basophils Absolute 10 0 - 200 cells/uL   Neutrophils Relative % 83.3 %   Total Lymphocyte 9.1 %   Monocytes Relative 6.5 %   Eosinophils Relative 1.0 %   Basophils Relative 0.1 %     Assessment and Plan: Marland KitchenMarland KitchenMarkeise was seen today for follow-up.  Diagnoses and all orders for this visit:  Pneumonia of both lower lobes due to infectious organism -     azithromycin (ZITHROMAX) 250 MG tablet; Take 2 tablets now and then one tablet for 4 days.  History of  2019 novel coronavirus disease (COVID-19) -     DG Chest 2 View -     COMPLETE METABOLIC PANEL WITH GFR -     CBC with Differential/Platelet  Weak -     DG Chest 2 View -     COMPLETE METABOLIC PANEL WITH GFR -     CBC with Differential/Platelet  Fever and chills -     DG Chest 2 View -     COMPLETE METABOLIC PANEL WITH GFR -     CBC with Differential/Platelet  Diarrhea, unspecified type  AKI (acute kidney injury) ([facility])  Hyponatremia   Pt is covid positive but well out of window. Day 23. Vitals fairely reassuring. CXR in office showed bilateral pneumonia likely due to covid but some concern for secondary bacterial infection. Will treat with zpak. Pt is not vomiting or diarrhea. He should be able to push fluids for next week to avoid OV for fluids. Labs showed some AKI, low sodium, low protein. Need to stay nourished get boost/ensure drinks to drink 3 times a day. Concerned that blood work is showing strain on body. Recheck in 2 days.    Follow Up Instructions:    I discussed the assessment and treatment plan with the patient. The patient was provided an opportunity to ask questions and all were answered. The patient agreed with the plan and demonstrated an understanding of the instructions.   The patient was advised to call back or seek an in-person evaluation if the symptoms worsen or if the condition fails to improve as anticipated.  I provided 23 minutes of non-face-to-face time during this encounter.   Tandy Gaw, PA-C

## 2019-03-27 NOTE — Telephone Encounter (Signed)
Ok he has pneumonia hold on labs start abx and then we can see how he is feeling in a few days.

## 2019-03-27 NOTE — Progress Notes (Signed)
Thinks dehydrated Had diarrhea a couple days Took imodium for one day and controlled it Has been trying to stay hydrated with Pedialyte/sports drinks Tested positive Covid - January 4th Past week has had weakness Fever 103 last night, normal now

## 2019-03-28 ENCOUNTER — Other Ambulatory Visit: Payer: Self-pay | Admitting: Neurology

## 2019-03-28 DIAGNOSIS — Z8616 Personal history of COVID-19: Secondary | ICD-10-CM

## 2019-03-28 DIAGNOSIS — R899 Unspecified abnormal finding in specimens from other organs, systems and tissues: Secondary | ICD-10-CM

## 2019-03-28 NOTE — Progress Notes (Signed)
Call pt:   His labs certainly show some covid stress. He does have some acute kidney injury due to virus strain. His sodium is low which could even cause some confusion. Protein is low due to malnutrition.   He should have started the zpak for pneumonia seen on CXR yesterday. If he is not losing fluids in the form of diarrhea or vomiting. He must push fluids and nutrition. Get him boost/ensure shakes to drink with every attempted meal. Drink water and even mixed with some gatorade for electrolytes. Recheck CMP on Friday.

## 2019-03-29 DIAGNOSIS — E871 Hypo-osmolality and hyponatremia: Secondary | ICD-10-CM | POA: Insufficient documentation

## 2019-03-29 DIAGNOSIS — N179 Acute kidney failure, unspecified: Secondary | ICD-10-CM | POA: Insufficient documentation

## 2019-03-29 DIAGNOSIS — J189 Pneumonia, unspecified organism: Secondary | ICD-10-CM | POA: Insufficient documentation

## 2019-04-03 LAB — COMPLETE METABOLIC PANEL WITH GFR
AG Ratio: 0.6 (calc) — ABNORMAL LOW (ref 1.0–2.5)
ALT: 48 U/L — ABNORMAL HIGH (ref 9–46)
AST: 86 U/L — ABNORMAL HIGH (ref 10–35)
Albumin: 3 g/dL — ABNORMAL LOW (ref 3.6–5.1)
Alkaline phosphatase (APISO): 98 U/L (ref 35–144)
BUN/Creatinine Ratio: 13 (calc) (ref 6–22)
BUN: 21 mg/dL (ref 7–25)
CO2: 24 mmol/L (ref 20–32)
Calcium: 9.9 mg/dL (ref 8.6–10.3)
Chloride: 99 mmol/L (ref 98–110)
Creat: 1.58 mg/dL — ABNORMAL HIGH (ref 0.70–1.18)
GFR, Est African American: 50 mL/min/{1.73_m2} — ABNORMAL LOW (ref >=60)
GFR, Est Non African American: 43 mL/min/{1.73_m2} — ABNORMAL LOW (ref >=60)
Globulin: 4.8 g/dL (calc) — ABNORMAL HIGH (ref 1.9–3.7)
Glucose, Bld: 124 mg/dL — ABNORMAL HIGH (ref 65–99)
Potassium: 4.8 mmol/L (ref 3.5–5.3)
Sodium: 133 mmol/L — ABNORMAL LOW (ref 135–146)
Total Bilirubin: 1.1 mg/dL (ref 0.2–1.2)
Total Protein: 7.8 g/dL (ref 6.1–8.1)

## 2019-04-03 NOTE — Progress Notes (Signed)
Call pt:  How is he feeling after starting zpak? His labs are starting to show improvement. Sodium is better, kidney function better. Still showing stress on liver and kidney. Still show low protein as with malnutrition.   Keep hydrating.  Keep EATING. At least 2 ensure/boost a day.   Recheck labs in 1 week.

## 2019-04-16 ENCOUNTER — Ambulatory Visit (INDEPENDENT_AMBULATORY_CARE_PROVIDER_SITE_OTHER): Payer: Medicare Other | Admitting: Physician Assistant

## 2019-04-16 ENCOUNTER — Encounter: Payer: Self-pay | Admitting: Physician Assistant

## 2019-04-16 ENCOUNTER — Other Ambulatory Visit: Payer: Self-pay

## 2019-04-16 VITALS — BP 157/104 | HR 115 | Ht 64.0 in | Wt 172.0 lb

## 2019-04-16 DIAGNOSIS — N179 Acute kidney failure, unspecified: Secondary | ICD-10-CM

## 2019-04-16 DIAGNOSIS — R748 Abnormal levels of other serum enzymes: Secondary | ICD-10-CM

## 2019-04-16 DIAGNOSIS — I10 Essential (primary) hypertension: Secondary | ICD-10-CM

## 2019-04-16 DIAGNOSIS — R21 Rash and other nonspecific skin eruption: Secondary | ICD-10-CM

## 2019-04-16 DIAGNOSIS — R7401 Elevation of levels of liver transaminase levels: Secondary | ICD-10-CM

## 2019-04-16 DIAGNOSIS — M7989 Other specified soft tissue disorders: Secondary | ICD-10-CM

## 2019-04-16 DIAGNOSIS — R609 Edema, unspecified: Secondary | ICD-10-CM | POA: Insufficient documentation

## 2019-04-16 DIAGNOSIS — Z8616 Personal history of COVID-19: Secondary | ICD-10-CM | POA: Diagnosis not present

## 2019-04-16 MED ORDER — TRIAMCINOLONE ACETONIDE 0.1 % EX CREA: 1.0000 "application " | TOPICAL_CREAM | Freq: Two times a day (BID) | CUTANEOUS | 0 refills | Status: AC

## 2019-04-16 NOTE — Progress Notes (Signed)
Subjective:    Patient ID: Chaos Carlile, male    DOB: 05/16/1945, 74 y.o.   MRN: [id number]  HPI  Pt is a 74 yo male with recent hx of covid infection at end of January with AKI and transaminitis with pneumonia. He is feeling much better today. He is eating and drinking. He has much more energy. He did notice an itchy rash over abdomen, trunk, lower legs after he finished antibiotic. He also had some lower extremity edema. He has been using eucerin which helps the itching and rash. The swelling resolved itself. No fever, chills, cough.   .. Active Ambulatory Problems    Diagnosis Date Noted  . Hypertension, essential, benign 09/20/2018  . Chronic gout without tophus 09/20/2018  . Nocturia 09/20/2018  . Hyperlipidemia 09/21/2018  . Elevated liver enzymes 09/21/2018  . Hypertriglyceridemia 09/21/2018  . Chronic pain of right knee 10/30/2018  . Hyponatremia 03/29/2019  . AKI (acute kidney injury) ([place]) 03/29/2019  . Pneumonia of both lower lobes due to infectious organism 03/29/2019  . Transaminitis 04/16/2019  . Swelling 04/16/2019  . History of COVID-19 04/16/2019   Resolved Ambulatory Problems    Diagnosis Date Noted  . No Resolved Ambulatory Problems   Past Medical History:  Diagnosis Date  . Gout   . Hypertension       Review of Systems See HPI.     Objective:   Physical Exam Vitals reviewed.  Constitutional:      Appearance: Normal appearance.  HENT:     Head: Normocephalic.  Cardiovascular:     Rate and Rhythm: Normal rate and regular rhythm.     Pulses: Normal pulses.  Pulmonary:     Effort: Pulmonary effort is normal.     Breath sounds: Normal breath sounds. No wheezing or rhonchi.  Musculoskeletal:     Right lower leg: No edema.     Left lower leg: No edema.  Skin:    Comments: Scaly dry skin around bilateral ankles with some erythema at its base. No warmth. No edema.   Scaly dry patches on low back and abdominal trunk.   Neurological:   General: No focal deficit present.     Mental Status: He is alert and oriented to person, place, and time.  Psychiatric:        Mood and Affect: Mood normal.           Assessment & Plan:  Marland KitchenMarland KitchenTrooper was seen today for edema and rash.  Diagnoses and all orders for this visit:  Rash -     COMPLETE METABOLIC PANEL WITH GFR -     CBC with Differential/Platelet -     triamcinolone cream (KENALOG) 0.1 %; Apply 1 application topically 2 (two) times daily. -     TSH  History of COVID-19 -     COMPLETE METABOLIC PANEL WITH GFR -     CBC with Differential/Platelet -     TSH  Elevated liver enzymes -     COMPLETE METABOLIC PANEL WITH GFR -     CBC with Differential/Platelet -     TSH  AKI (acute kidney injury) ([place]) -     COMPLETE METABOLIC PANEL WITH GFR  Localized swelling of lower extremity -     TSH  Transaminitis  Hypertension, essential, benign   Pt lung and heart sound good. No seen swelling today. Rash seems more like really dry skin perhaps from AKI or even covid infection and malnutrition issues. I sent topical steroid to  combine with eucerin to use on rash. Make sure to stay hydrated. Will recheck kidney, protein, liver to make sure heading in the right direction of stress on organs via covid.   Pt did not take BP medication today. Reminder to always take BP medication. Keep monitoring BP at home.   Follow up as needed or if symptoms worsening.

## 2019-04-16 NOTE — Patient Instructions (Signed)

## 2019-07-22 ENCOUNTER — Telehealth: Payer: Self-pay | Admitting: Physician Assistant

## 2019-07-22 NOTE — Telephone Encounter (Signed)
Requested colonoscopy report.

## 2019-07-22 NOTE — Telephone Encounter (Signed)
05/20/19 colonoscopy with [facility]. Please abstract

## 2020-07-12 IMAGING — DX DG CHEST 2V
2 series · 2 of 2 positions shown · non-contrast
Comparison: None.

CLINICAL DATA: Fever and chills. Weakness. [id number] diagnosed 23
days ago.

EXAM:
CHEST - 2 VIEW

[chest pa]
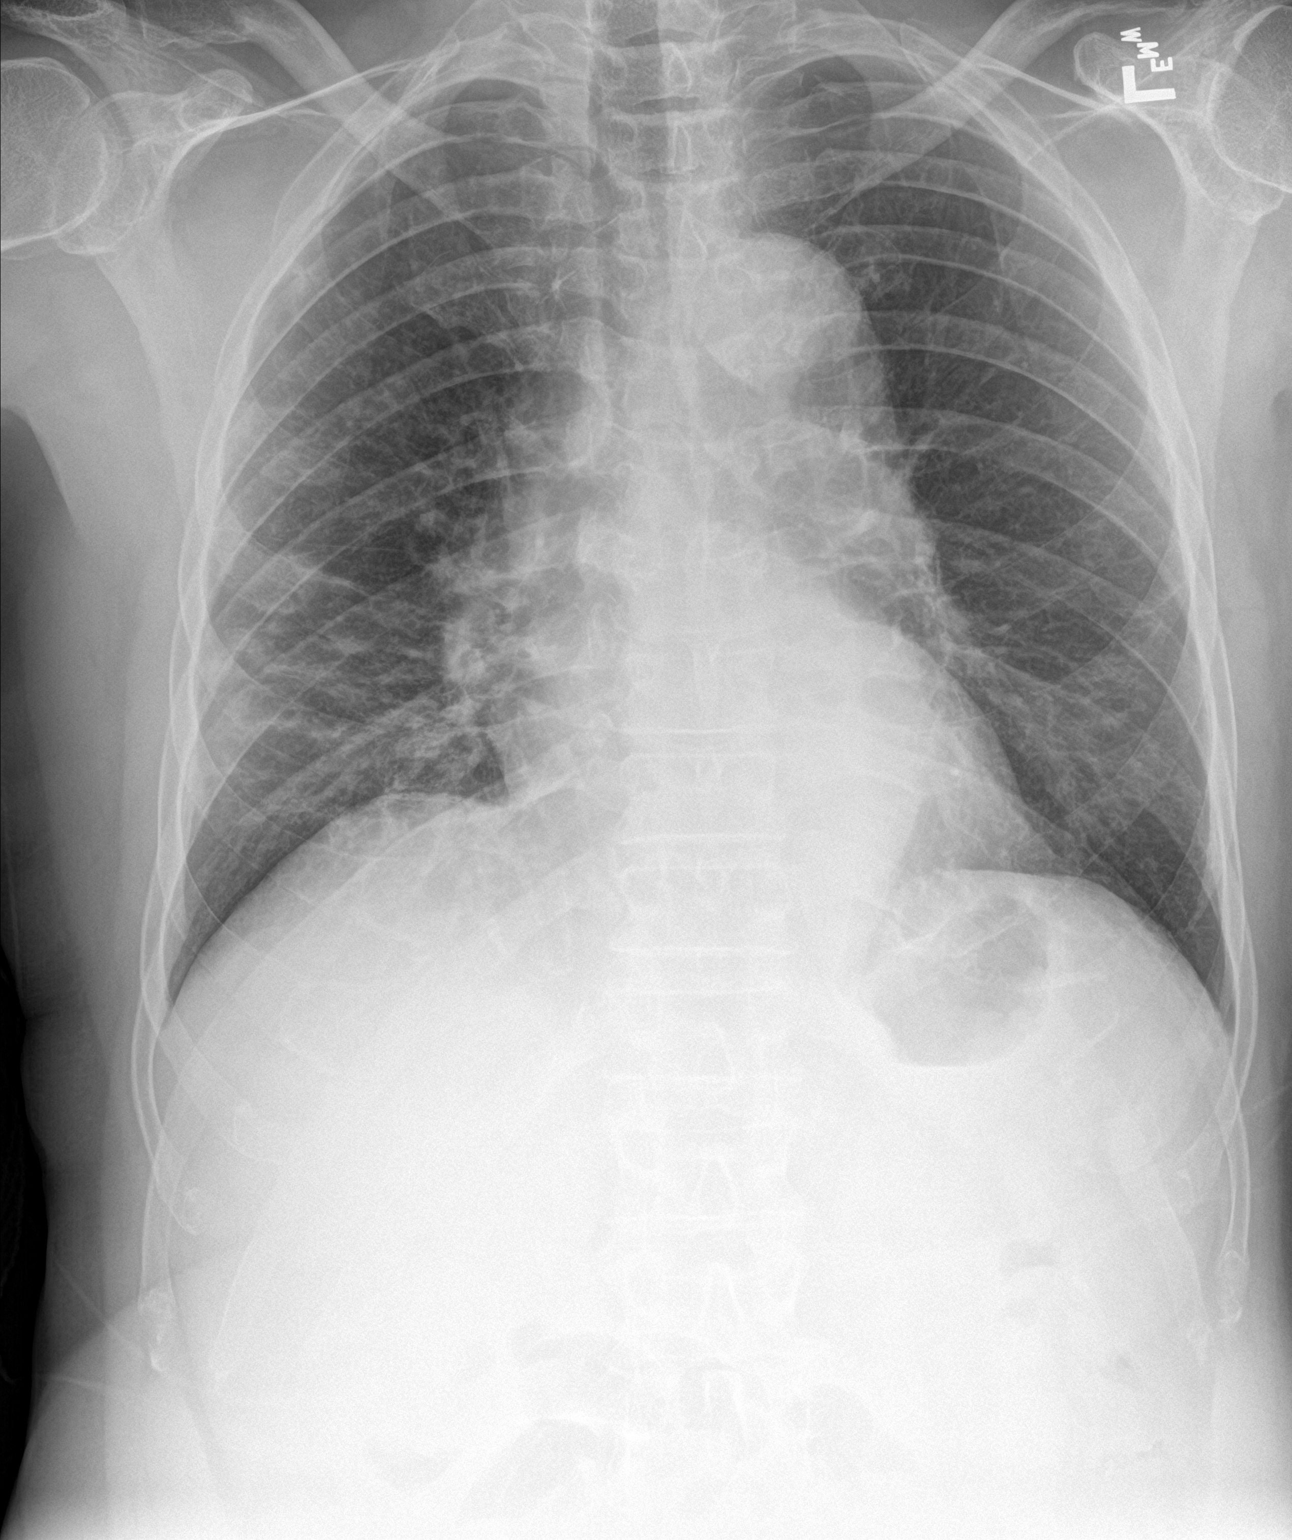

[chest lat]
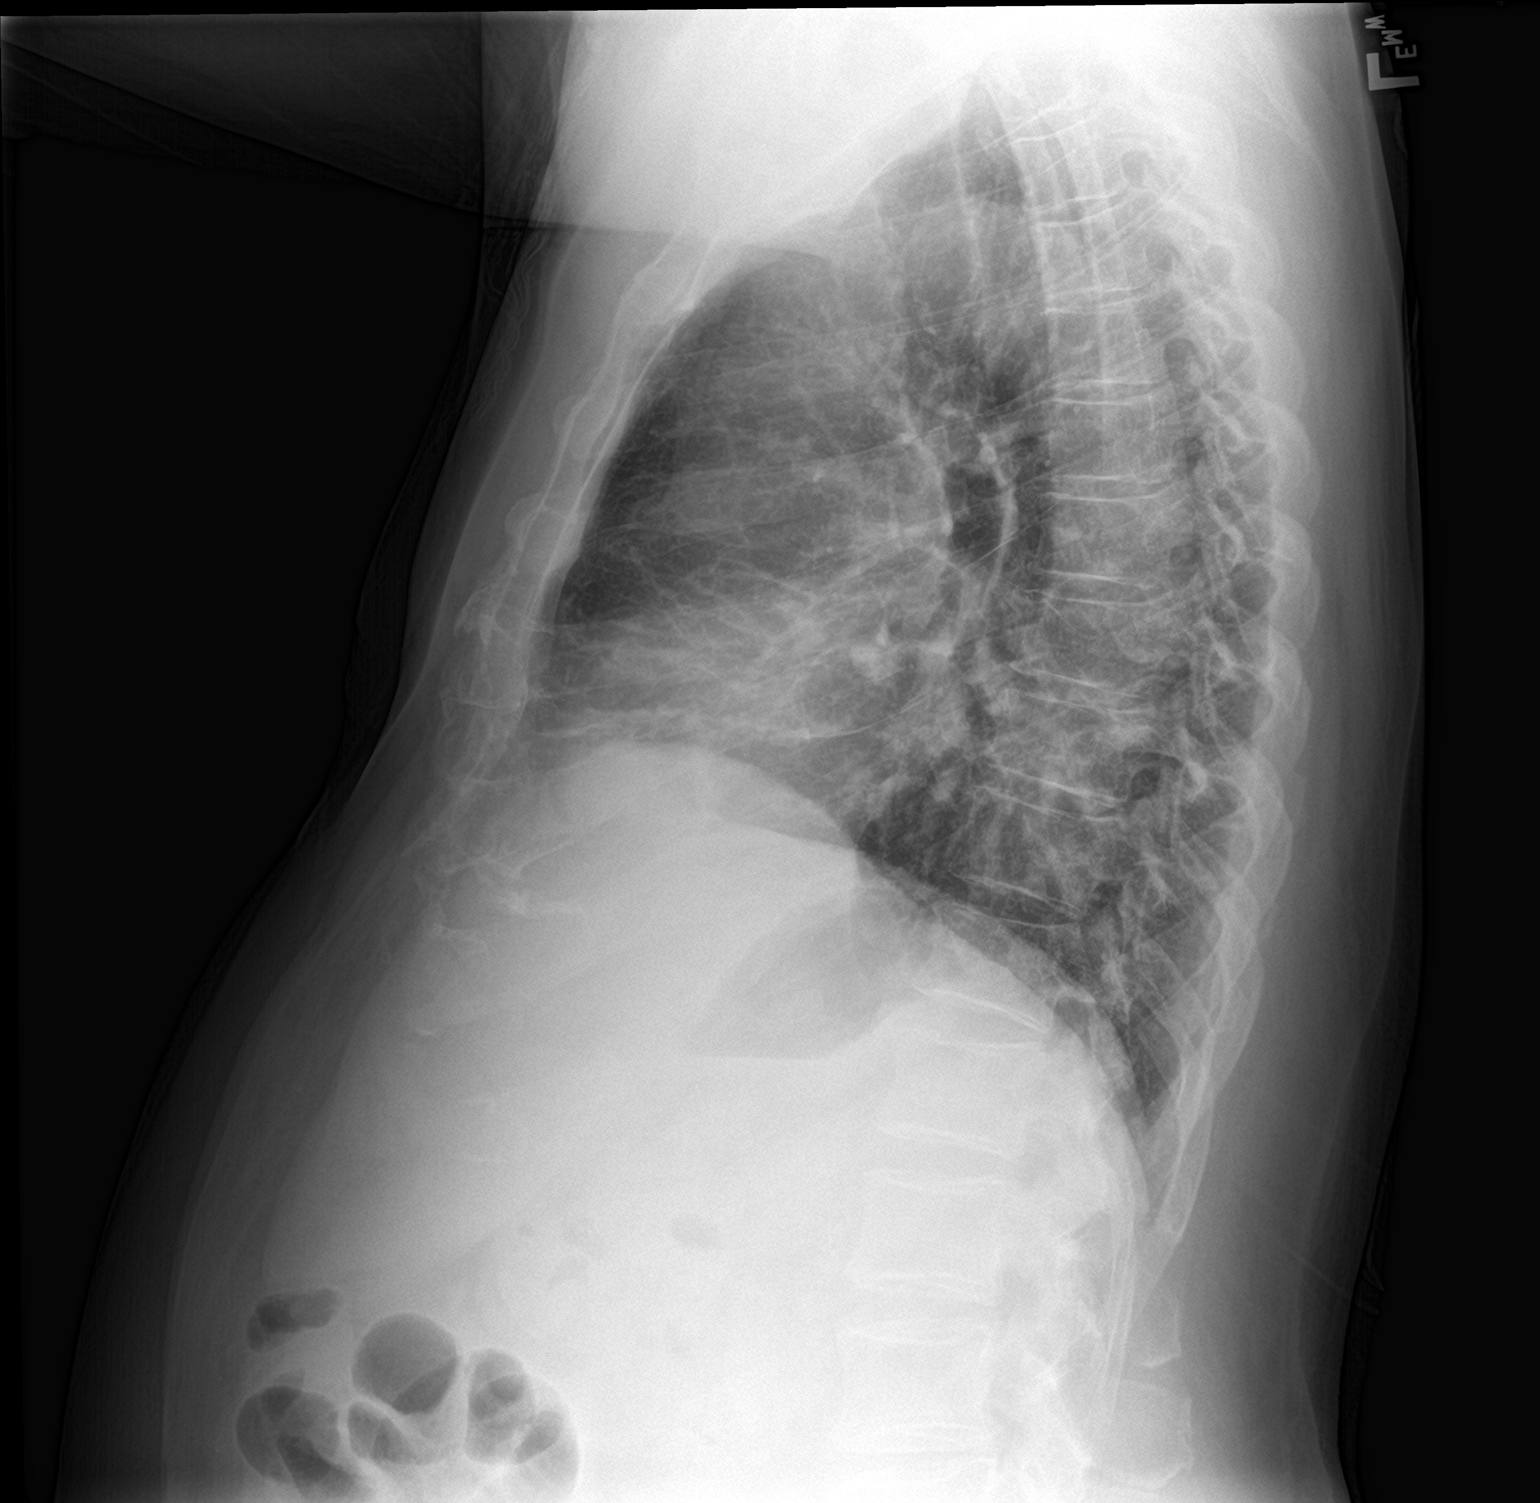

[2 of 2 positions shown; findings below may reference images not displayed]

FINDINGS: There small peripheral infiltrates in the right lung as well as a
tiny hazy infiltrate in the left lung base.

No effusions.

Heart size and vascularity are normal. Bones are normal.
IMPRESSION: Bilateral pulmonary infiltrates consistent with pneumonia. Pattern
is typical for COVID pneumonia.

## 2020-08-26 ENCOUNTER — Ambulatory Visit: Payer: Medicare Other

## 2021-01-11 ENCOUNTER — Telehealth: Payer: Self-pay | Admitting: General Practice

## 2021-01-11 NOTE — Telephone Encounter (Signed)
Transition Care Management Follow-up Telephone Call Date of discharge and from where: 01/10/21 from [facility] How have you been since you were released from the hospital? Doing better. He is taking his muscle relaxer.  Any questions or concerns? No  Items Reviewed: Did the pt receive and understand the discharge instructions provided? Yes  Medications obtained and verified? Yes  Other? No  Any new allergies since your discharge? No  Dietary orders reviewed? Yes Do you have support at home? Yes   Home Care and Equipment/Supplies: Were home health services ordered? noI  Functional Questionnaire: (I = Independent and D = Dependent) ADLs: I  Bathing/Dressing- I  Meal Prep- I  Eating- I  Maintaining continence- I  Transferring/Ambulation- I  Managing Meds- I  Follow up appointments reviewed:  PCP Hospital f/u appt confirmed? No   Specialist Hospital f/u appt confirmed? No   Are transportation arrangements needed? No  If their condition worsens, is the pt aware to call PCP or go to the Emergency Dept.? Yes Was the patient provided with contact information for the PCP's office or ED? Yes Was to pt encouraged to call back with questions or concerns? Yes
